# Patient Record
Sex: Male | Born: 1995 | Race: Black or African American | Hispanic: No | Marital: Single
Health system: Southern US, Community
[De-identification: ages and names within clinical notes are randomized; demographics above are authoritative.]

---

## 2018-10-31 ENCOUNTER — Other Ambulatory Visit: Payer: Self-pay

## 2018-10-31 ENCOUNTER — Emergency Department: Payer: Self-pay

## 2018-10-31 ENCOUNTER — Encounter: Payer: Self-pay | Admitting: Emergency Medicine

## 2018-10-31 ENCOUNTER — Emergency Department
Admission: EM | Admit: 2018-10-31 | Discharge: 2018-10-31 | Disposition: A | Discharge status: Home / Self Care | Payer: Self-pay | Attending: Student in an Organized Health Care Education/Training Program | Admitting: Student in an Organized Health Care Education/Training Program

## 2018-10-31 DIAGNOSIS — R109 Unspecified abdominal pain: Secondary | ICD-10-CM

## 2018-10-31 DIAGNOSIS — N200 Calculus of kidney: Secondary | ICD-10-CM | POA: Insufficient documentation

## 2018-10-31 DIAGNOSIS — N210 Calculus in bladder: Secondary | ICD-10-CM | POA: Insufficient documentation

## 2018-10-31 LAB — CBC WITH DIFFERENTIAL/PLATELET
Abs Immature Granulocytes: 0.01 10*3/uL (ref 0.00–0.07)
Basophils Absolute: 0 10*3/uL (ref 0.0–0.1)
Basophils Relative: 0 %
Eosinophils Absolute: 0 10*3/uL (ref 0.0–0.5)
Eosinophils Relative: 1 %
HCT: 39.4 % (ref 39.0–52.0)
Hemoglobin: 12.2 g/dL — ABNORMAL LOW (ref 13.0–17.0)
Immature Granulocytes: 0 %
Lymphocytes Relative: 43 %
Lymphs Abs: 2.1 10*3/uL (ref 0.7–4.0)
MCH: 21.6 pg — ABNORMAL LOW (ref 26.0–34.0)
MCHC: 31 g/dL (ref 30.0–36.0)
MCV: 69.9 fL — ABNORMAL LOW (ref 80.0–100.0)
Monocytes Absolute: 0.5 10*3/uL (ref 0.1–1.0)
Monocytes Relative: 11 %
Neutro Abs: 2.2 10*3/uL (ref 1.7–7.7)
Neutrophils Relative %: 45 %
Platelets: 231 10*3/uL (ref 150–400)
RBC: 5.64 MIL/uL (ref 4.22–5.81)
RDW: 16.3 % — ABNORMAL HIGH (ref 11.5–15.5)
Smear Review: NORMAL
WBC: 4.8 10*3/uL (ref 4.0–10.5)
nRBC: 0 % (ref 0.0–0.2)

## 2018-10-31 LAB — URINALYSIS, COMPLETE (UACMP) WITH MICROSCOPIC
Bacteria, UA: NONE SEEN
Bilirubin Urine: NEGATIVE
Glucose, UA: NEGATIVE mg/dL
Ketones, ur: NEGATIVE mg/dL
Leukocytes,Ua: NEGATIVE
Nitrite: NEGATIVE
Protein, ur: NEGATIVE mg/dL
RBC / HPF: >50 RBC/hpf — ABNORMAL HIGH (ref 0–5)
Specific Gravity, Urine: 1.025 (ref 1.005–1.030)
pH: 5 (ref 5.0–8.0)

## 2018-10-31 LAB — COMPREHENSIVE METABOLIC PANEL WITH GFR
ALT: 13 U/L (ref 0–44)
AST: 19 U/L (ref 15–41)
Albumin: 4.7 g/dL (ref 3.5–5.0)
Alkaline Phosphatase: 90 U/L (ref 38–126)
Anion gap: 11 (ref 5–15)
BUN: 17 mg/dL (ref 6–20)
CO2: 24 mmol/L (ref 22–32)
Calcium: 9.3 mg/dL (ref 8.9–10.3)
Chloride: 103 mmol/L (ref 98–111)
Creatinine, Ser: 1.12 mg/dL (ref 0.61–1.24)
GFR calc Af Amer: >60 mL/min
GFR calc non Af Amer: >60 mL/min
Glucose, Bld: 106 mg/dL — ABNORMAL HIGH (ref 70–99)
Potassium: 3.8 mmol/L (ref 3.5–5.1)
Sodium: 138 mmol/L (ref 135–145)
Total Bilirubin: 0.5 mg/dL (ref 0.3–1.2)
Total Protein: 8.1 g/dL (ref 6.5–8.1)

## 2018-10-31 LAB — LIPASE, BLOOD: Lipase: 18 U/L (ref 11–51)

## 2018-10-31 MED ORDER — MORPHINE SULFATE (PF) 4 MG/ML IV SOLN
4.0000 mg | INTRAVENOUS | Status: DC | PRN
  Administered 2018-10-31: 4 mg via INTRAVENOUS
  Filled 2018-10-31: qty 1

## 2018-10-31 MED ORDER — SODIUM CHLORIDE 0.9 % IV BOLUS
500.0000 mL | Freq: Once | INTRAVENOUS | Status: AC
  Administered 2018-10-31: 500 mL via INTRAVENOUS

## 2018-10-31 MED ORDER — KETOROLAC TROMETHAMINE 30 MG/ML IJ SOLN
15.0000 mg | Freq: Once | INTRAMUSCULAR | Status: AC
  Administered 2018-10-31: 15 mg via INTRAVENOUS
  Filled 2018-10-31: qty 1

## 2018-10-31 MED ORDER — ONDANSETRON HCL 4 MG/2ML IJ SOLN
4.0000 mg | Freq: Once | INTRAMUSCULAR | Status: AC
  Administered 2018-10-31: 4 mg via INTRAVENOUS
  Filled 2018-10-31: qty 2

## 2018-10-31 NOTE — ED Triage Notes (Signed)
Pt arrived via POV with reports of right flank pain and right lower abdominal pain since this morning. Pt also reports vomiting.

## 2018-10-31 NOTE — ED Provider Notes (Signed)
[facility] Emergency Department Provider Note    First MD Initiated Contact with Patient 10/31/18 1144     (approximate)  I have reviewed the triage vital signs and the nursing notes.   HISTORY  Chief Complaint Flank Pain and Abdominal Pain    HPI Jeremy Hull is a 23 y.o. male no significant past medical history presents the ER for evaluation of right flank and right lower quadrant abdominal pain that awoke the patient from sleep early this morning.  Denies any fevers.  States the pain does cause him to have some nausea.  Does feel like he has urgency and need to pee.  Has not had a surgeries on his abdomen.  No previous medical history.  Denies any trauma.    History reviewed. No pertinent past medical history. History reviewed. No pertinent family history.  There are no active problems to display for this patient.     Prior to Admission medications   Not on File    Allergies Patient has no known allergies.    Social History Social History   Tobacco Use  . Smoking status: Not on file  Substance Use Topics  . Alcohol use: Not on file  . Drug use: Not on file    Review of Systems Patient denies headaches, rhinorrhea, blurry vision, numbness, shortness of breath, chest pain, edema, cough, abdominal pain, nausea, vomiting, diarrhea, dysuria, fevers, rashes or hallucinations unless otherwise stated above in HPI. ____________________________________________   PHYSICAL EXAM:  VITAL SIGNS: Vitals:   10/31/18 1210 10/31/18 1230  BP: 111/81 111/82  Pulse:  68  Resp: 20   Temp:    SpO2: 99% 100%    Constitutional: Alert and oriented.  Eyes: Conjunctivae are normal.  Head: Atraumatic. Nose: No congestion/rhinnorhea. Mouth/Throat: Mucous membranes are moist.   Neck: No stridor. Painless ROM.  Cardiovascular: Normal rate, regular rhythm. Grossly normal heart sounds.  Good peripheral circulation. Respiratory: Normal respiratory  effort.  No retractions. Lungs CTAB. Gastrointestinal: Soft and nontender. No distention. No abdominal bruits. Mild right CVA tenderness. Genitourinary:  Musculoskeletal: No lower extremity tenderness nor edema.  No joint effusions. Neurologic:  Normal speech and language. No gross focal neurologic deficits are appreciated. No facial droop Skin:  Skin is warm, dry and intact. No rash noted. Psychiatric: Mood and affect are normal. Speech and behavior are normal.  ____________________________________________   LABS (all labs ordered are listed, but only abnormal results are displayed)  Results for orders placed or performed during the hospital encounter of 10/31/18 (from the past 24 hour(s))  CBC with Differential/Platelet     Status: Abnormal   Collection Time: 10/31/18 11:55 AM  Result Value Ref Range   WBC 4.8 4.0 - 10.5 K/uL   RBC 5.64 4.22 - 5.81 MIL/uL   Hemoglobin 12.2 (L) 13.0 - 17.0 g/dL   HCT 16.139.4 09.639.0 - 04.552.0 %   MCV 69.9 (L) 80.0 - 100.0 fL   MCH 21.6 (L) 26.0 - 34.0 pg   MCHC 31.0 30.0 - 36.0 g/dL   RDW 40.916.3 (H) 81.111.5 - 91.415.5 %   Platelets 231 150 - 400 K/uL   nRBC 0.0 0.0 - 0.2 %   Neutrophils Relative % 45 %   Neutro Abs 2.2 1.7 - 7.7 K/uL   Lymphocytes Relative 43 %   Lymphs Abs 2.1 0.7 - 4.0 K/uL   Monocytes Relative 11 %   Monocytes Absolute 0.5 0.1 - 1.0 K/uL   Eosinophils Relative 1 %   Eosinophils  Absolute 0.0 0.0 - 0.5 K/uL   Basophils Relative 0 %   Basophils Absolute 0.0 0.0 - 0.1 K/uL   WBC Morphology MORPHOLOGY UNREMARKABLE    RBC Morphology MORPHOLOGY UNREMARKABLE    Smear Review Normal platelet morphology    Immature Granulocytes 0 %   Abs Immature Granulocytes 0.01 0.00 - 0.07 K/uL  Comprehensive metabolic panel     Status: Abnormal   Collection Time: 10/31/18 11:55 AM  Result Value Ref Range   Sodium 138 135 - 145 mmol/L   Potassium 3.8 3.5 - 5.1 mmol/L   Chloride 103 98 - 111 mmol/L   CO2 24 22 - 32 mmol/L   Glucose, Bld 106 (H) 70 - 99  mg/dL   BUN 17 6 - 20 mg/dL   Creatinine, Ser 4.091.12 0.61 - 1.24 mg/dL   Calcium 9.3 8.9 - 81.110.3 mg/dL   Total Protein 8.1 6.5 - 8.1 g/dL   Albumin 4.7 3.5 - 5.0 g/dL   AST 19 15 - 41 U/L   ALT 13 0 - 44 U/L   Alkaline Phosphatase 90 38 - 126 U/L   Total Bilirubin 0.5 0.3 - 1.2 mg/dL   GFR calc non Af Amer >60 >60 mL/min   GFR calc Af Amer >60 >60 mL/min   Anion gap 11 5 - 15  Lipase, blood     Status: None   Collection Time: 10/31/18 11:55 AM  Result Value Ref Range   Lipase 18 11 - 51 U/L  Urinalysis, Complete w Microscopic     Status: Abnormal   Collection Time: 10/31/18 11:55 AM  Result Value Ref Range   Color, Urine YELLOW (A) YELLOW   APPearance HAZY (A) CLEAR   Specific Gravity, Urine 1.025 1.005 - 1.030   pH 5.0 5.0 - 8.0   Glucose, UA NEGATIVE NEGATIVE mg/dL   Hgb urine dipstick LARGE (A) NEGATIVE   Bilirubin Urine NEGATIVE NEGATIVE   Ketones, ur NEGATIVE NEGATIVE mg/dL   Protein, ur NEGATIVE NEGATIVE mg/dL   Nitrite NEGATIVE NEGATIVE   Leukocytes,Ua NEGATIVE NEGATIVE   RBC / HPF >50 (H) 0 - 5 RBC/hpf   WBC, UA 0-5 0 - 5 WBC/hpf   Bacteria, UA NONE SEEN NONE SEEN   Squamous Epithelial / LPF 0-5 0 - 5   Mucus PRESENT    ____________________________________________ ____________________________________________  RADIOLOGY  I personally reviewed all radiographic images ordered to evaluate for the above acute complaints and reviewed radiology reports and findings.  These findings were personally discussed with the patient.  Please see medical record for radiology report.  ____________________________________________   PROCEDURES  Procedure(s) performed:  Procedures    Critical Care performed: no ____________________________________________   INITIAL IMPRESSION / ASSESSMENT AND PLAN / ED COURSE  Pertinent labs & imaging results that were available during my care of the patient were reviewed by me and considered in my medical decision making (see chart for  details).   DDX: Stone, Pyelo, appendicitis, enteritis, colitis, musculoskeletal strain, UTI  Jeremy HoehnSakwon Lagasse is a 23 y.o. who presents to the ED with right flank pain as described above.  Patient behaving and symptoms present are most clinically consistent with kidney stone.  CT imaging will be ordered to evaluate for blood differential.  Clinical Course as of Oct 31 1346  Sun Oct 31, 2018  1347 Patient with improvement in symptoms.  CT imaging shows evidence of probable recently passed small 2 mm ureteral stone.  Presentation consistent with ureterolithiasis now resolved.  Patient stable and appropriate  for outpatient follow-up.   [PR]    Clinical Course User Index [PR] Merlyn Lot, MD    The patient was evaluated in Emergency Department today for the symptoms described in the history of present illness. He/she was evaluated in the context of the global COVID-19 pandemic, which necessitated consideration that the patient might be at risk for infection with the SARS-CoV-2 virus that causes COVID-19. Institutional protocols and algorithms that pertain to the evaluation of patients at risk for COVID-19 are in a state of rapid change based on information released by regulatory bodies including the CDC and federal and state organizations. These policies and algorithms were followed during the patient's care in the ED.  As part of my medical decision making, I reviewed the following data within the [place] notes reviewed and incorporated, Labs reviewed, notes from prior ED visits and [place] Controlled Substance Database   ____________________________________________   FINAL CLINICAL IMPRESSION(S) / ED DIAGNOSES  Final diagnoses:  Right flank pain  Kidney stone      NEW MEDICATIONS STARTED DURING THIS VISIT:  New Prescriptions   No medications on file     Note:  This document was prepared using Dragon voice recognition software and may include  unintentional dictation errors.    Merlyn Lot, MD 10/31/18 1350

## 2020-07-05 IMAGING — CT CT RENAL STONE PROTOCOL
3 of 4 series · 9 of 46 positions shown, 16 images · non-contrast
Comparison: None.

CLINICAL DATA: Right flank pain, right lower abdominal pain

EXAM:
CT ABDOMEN AND PELVIS WITHOUT CONTRAST
TECHNIQUE: Multidetector CT imaging of the abdomen and pelvis was performed
following the standard protocol without IV contrast.

[Series 4: lung bases · axial · 0.63mm/px · z∈[-762,-682]mm · 5 of 26 slices shown, 10 images]
[im 5/26  soft-tissue]
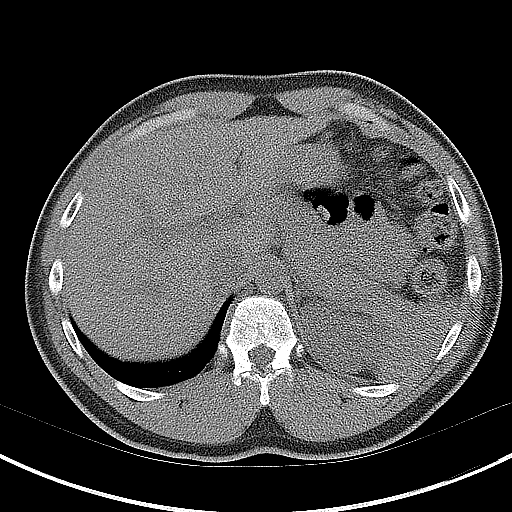
[im 5/26  bone]
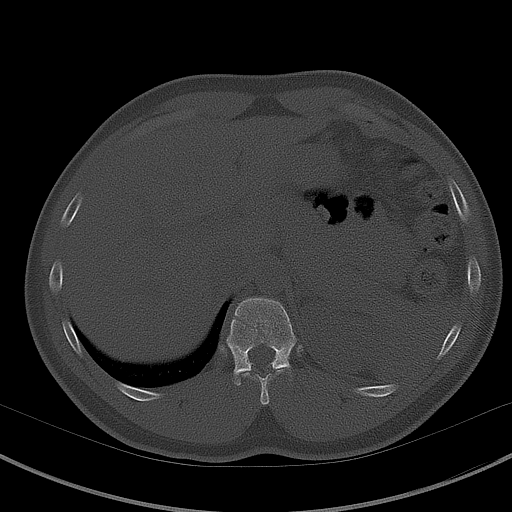
[im 9/26  soft-tissue]
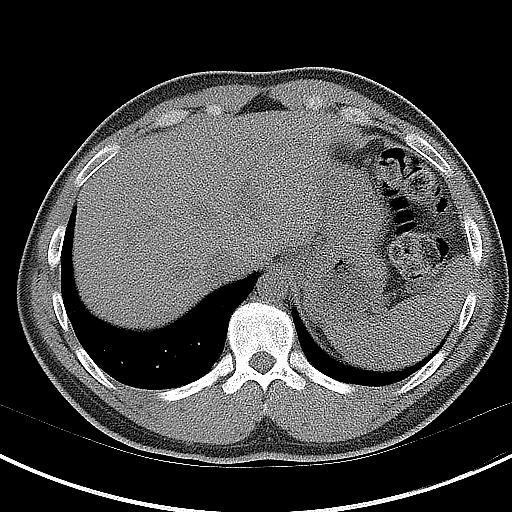
[im 9/26  lung]
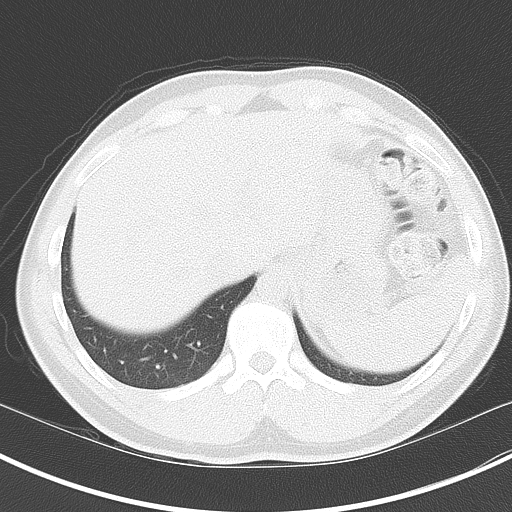
[im 13/26  soft-tissue]
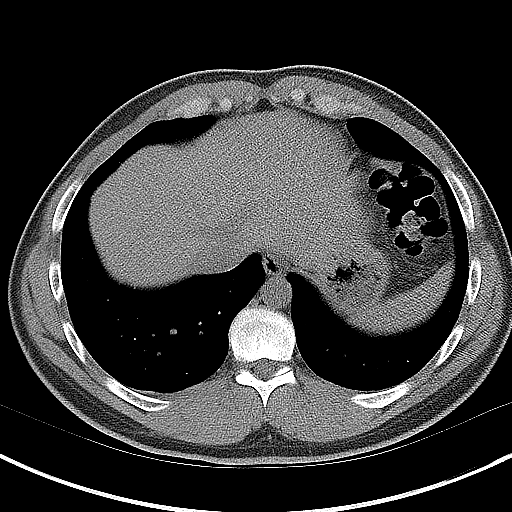
[im 13/26  lung]
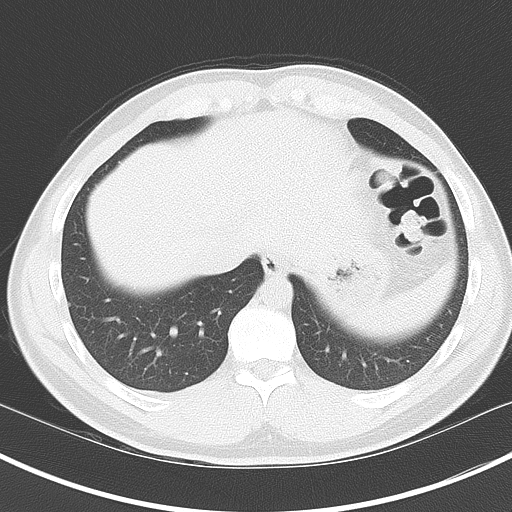
[im 17/26  soft-tissue]
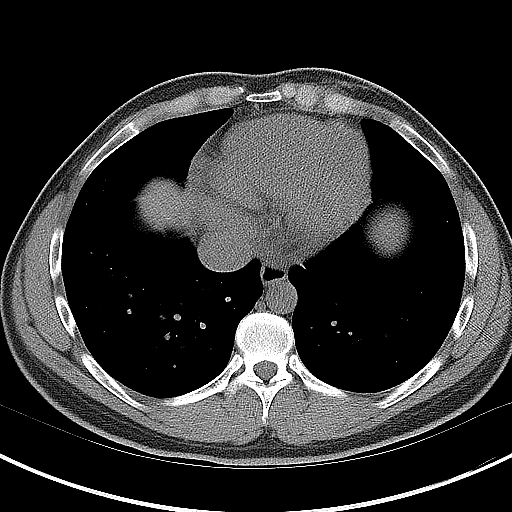
[im 17/26  lung]
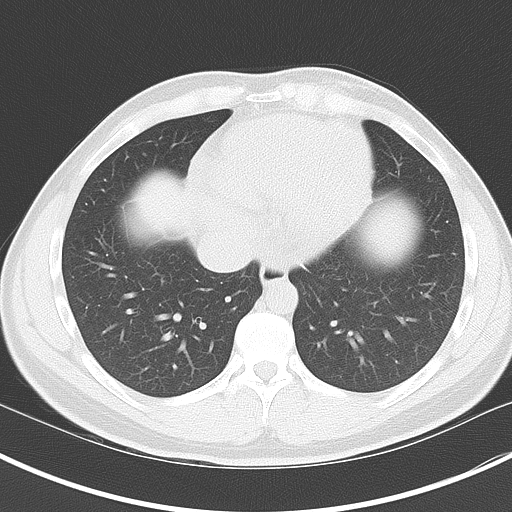
[im 21/26  soft-tissue]
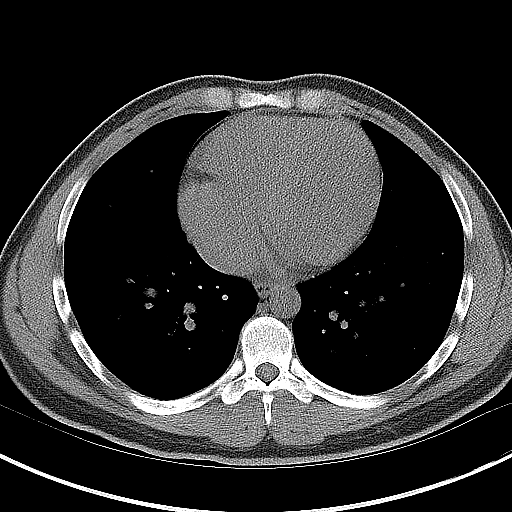
[im 21/26  lung]
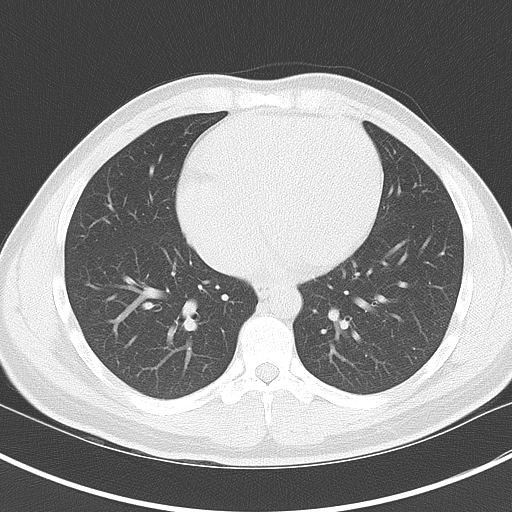

[Series 5: coronal · coronal · 0.70mm/px · 3 of 115 slices shown, 4 images]
[im 39/115  soft-tissue]
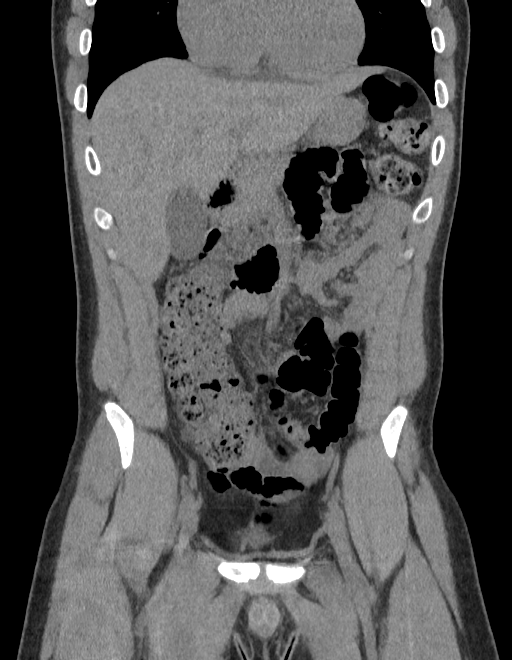
[im 51/115  soft-tissue]
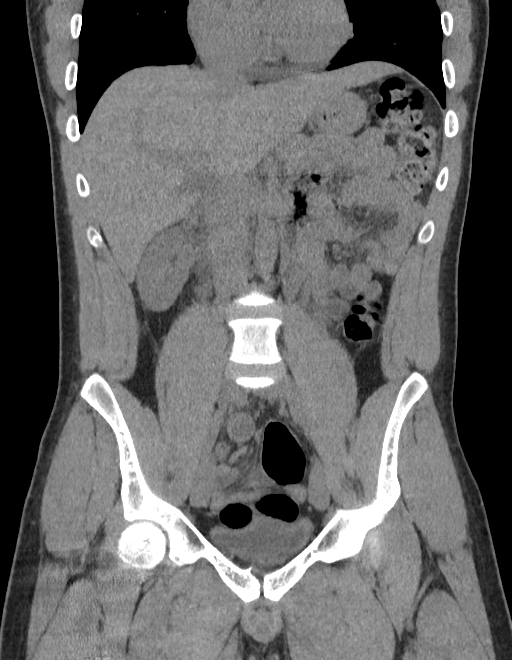
[im 51/115  bone]
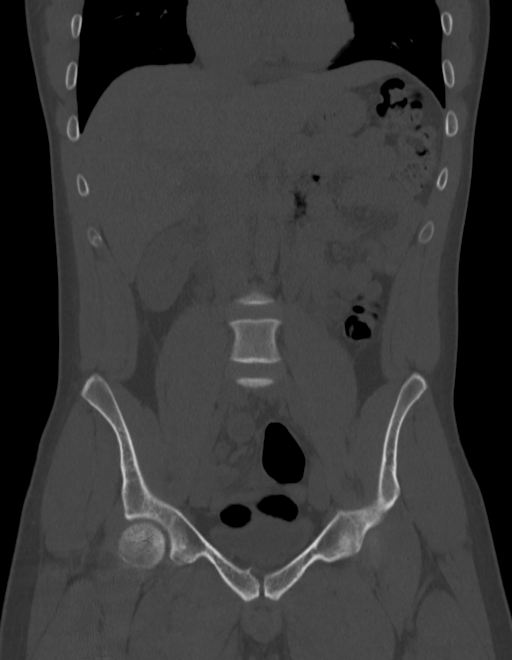
[im 64/115  soft-tissue]
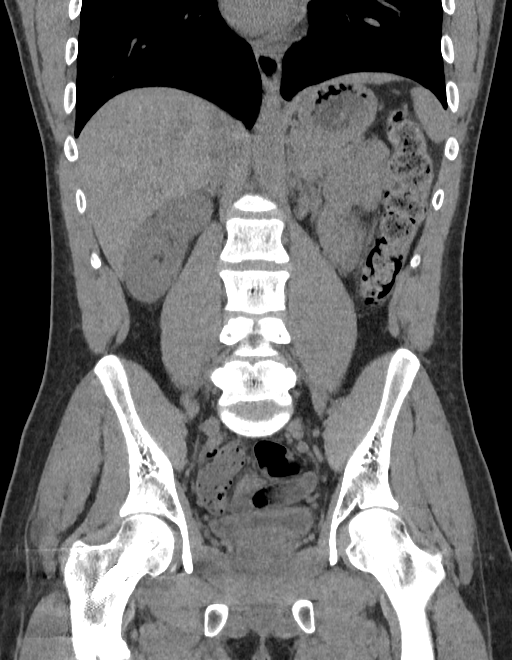

[Series 6: sagittal · sagittal · 0.65mm/px · 1 of 151 slices shown, 2 images]
[im 51/151  soft-tissue]
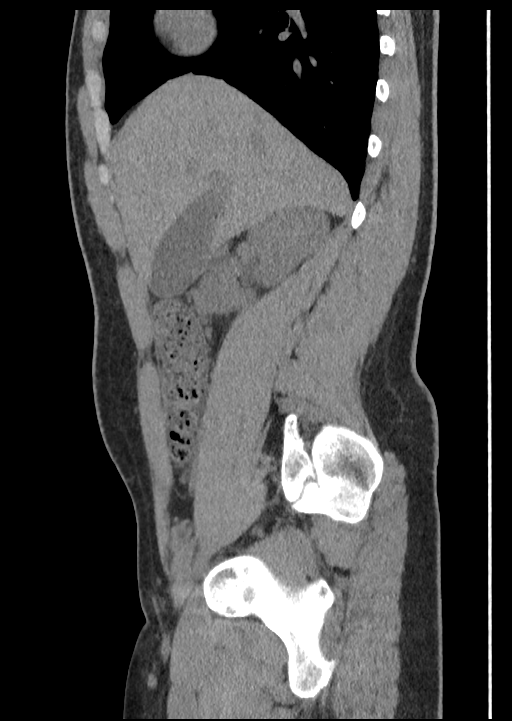
[im 51/151  bone]
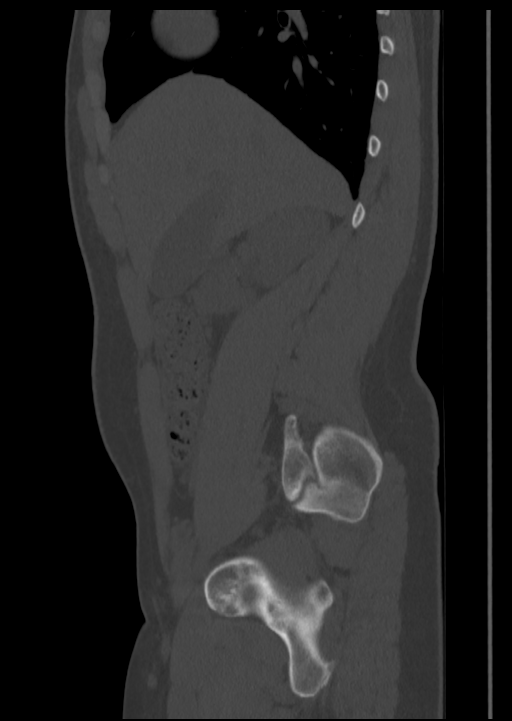

[9 of 46 positions shown; findings below may reference images not displayed]

FINDINGS: Lower chest: No acute abnormality.

Hepatobiliary: No focal liver abnormality is seen. No gallstones,
gallbladder wall thickening, or biliary dilatation.

Pancreas: Unremarkable. No pancreatic ductal dilatation or
surrounding inflammatory changes.

Spleen: Normal in size without focal abnormality.

Adrenals/Urinary Tract: Adrenal glands are unremarkable. Punctate
nonobstructing left renal calculus. No other renal calculi. 2 mm
calculus within the bladder along the left side likely reflecting a
recently passed calculus. No obstructive uropathy. No renal mass.
Bladder is unremarkable.

Stomach/Bowel: Stomach is within normal limits. Appendix appears
normal. No evidence of bowel wall thickening, distention, or
inflammatory changes. Large amount of stool in the ascending and
transverse colon.

Vascular/Lymphatic: No significant vascular findings are present. No
enlarged abdominal or pelvic lymph nodes.

Reproductive: Prostate is unremarkable.

Other: No abdominal wall hernia or abnormality. No abdominopelvic
ascites.

Musculoskeletal: No acute or significant osseous findings. Prior
left femoral ORIF.
IMPRESSION: 1. 2 mm calculus within the bladder along the left side likely
reflecting a recently passed calculus. No obstructive uropathy.
2. Punctate nonobstructing left renal calculus.
3.  Large amount of stool in the ascending and transverse colon.

## 2024-04-10 DEATH — deceased
